# Patient Record
Sex: Male | Born: 1960 | Race: White | Hispanic: No | Marital: Married | Smoking: Former smoker
Health system: Southern US, Community
[De-identification: ages and names within clinical notes are randomized; demographics above are authoritative.]

## PROBLEM LIST (undated history)

## (undated) DIAGNOSIS — F419 Anxiety disorder, unspecified: Secondary | ICD-10-CM

## (undated) DIAGNOSIS — I82409 Acute embolism and thrombosis of unspecified deep veins of unspecified lower extremity: Secondary | ICD-10-CM

## (undated) DIAGNOSIS — I2699 Other pulmonary embolism without acute cor pulmonale: Secondary | ICD-10-CM

---

## 2012-03-17 ENCOUNTER — Ambulatory Visit: Payer: Self-pay | Admitting: Internal Medicine

## 2014-10-22 DIAGNOSIS — I82409 Acute embolism and thrombosis of unspecified deep veins of unspecified lower extremity: Secondary | ICD-10-CM

## 2014-10-22 DIAGNOSIS — I2699 Other pulmonary embolism without acute cor pulmonale: Secondary | ICD-10-CM

## 2014-10-22 HISTORY — DX: Acute embolism and thrombosis of unspecified deep veins of unspecified lower extremity: I82.409

## 2014-10-22 HISTORY — DX: Other pulmonary embolism without acute cor pulmonale: I26.99

## 2015-11-01 ENCOUNTER — Emergency Department: Payer: Self-pay

## 2015-11-01 ENCOUNTER — Emergency Department
Admission: EM | Admit: 2015-11-01 | Discharge: 2015-11-01 | Disposition: A | Discharge status: Home / Self Care | Payer: Self-pay | Attending: Emergency Medicine | Admitting: Emergency Medicine

## 2015-11-01 DIAGNOSIS — W108XXA Fall (on) (from) other stairs and steps, initial encounter: Secondary | ICD-10-CM | POA: Insufficient documentation

## 2015-11-01 DIAGNOSIS — S82001A Unspecified fracture of right patella, initial encounter for closed fracture: Secondary | ICD-10-CM

## 2015-11-01 DIAGNOSIS — S82041A Displaced comminuted fracture of right patella, initial encounter for closed fracture: Secondary | ICD-10-CM | POA: Insufficient documentation

## 2015-11-01 DIAGNOSIS — S82153A Displaced fracture of unspecified tibial tuberosity, initial encounter for closed fracture: Secondary | ICD-10-CM

## 2015-11-01 DIAGNOSIS — Z880 Allergy status to penicillin: Secondary | ICD-10-CM | POA: Insufficient documentation

## 2015-11-01 DIAGNOSIS — Y9289 Other specified places as the place of occurrence of the external cause: Secondary | ICD-10-CM | POA: Insufficient documentation

## 2015-11-01 DIAGNOSIS — Y9389 Activity, other specified: Secondary | ICD-10-CM | POA: Insufficient documentation

## 2015-11-01 DIAGNOSIS — S82151A Displaced fracture of right tibial tuberosity, initial encounter for closed fracture: Secondary | ICD-10-CM | POA: Insufficient documentation

## 2015-11-01 DIAGNOSIS — Y998 Other external cause status: Secondary | ICD-10-CM | POA: Insufficient documentation

## 2015-11-01 MED ORDER — OXYCODONE-ACETAMINOPHEN 5-325 MG PO TABS
1.0000 | ORAL_TABLET | Freq: Once | ORAL | Status: AC
  Administered 2015-11-01: 1 via ORAL
  Filled 2015-11-01: qty 1

## 2015-11-01 MED ORDER — MORPHINE SULFATE (PF) 4 MG/ML IV SOLN
4.0000 mg | Freq: Once | INTRAVENOUS | Status: AC
  Administered 2015-11-01: 4 mg via INTRAVENOUS
  Filled 2015-11-01: qty 1

## 2015-11-01 MED ORDER — FENTANYL CITRATE (PF) 100 MCG/2ML IJ SOLN
INTRAMUSCULAR | Status: AC
  Administered 2015-11-01: 50 ug via INTRAVENOUS
  Filled 2015-11-01: qty 2

## 2015-11-01 MED ORDER — FENTANYL CITRATE (PF) 100 MCG/2ML IJ SOLN
50.0000 ug | Freq: Once | INTRAMUSCULAR | Status: AC
  Administered 2015-11-01: 50 ug via INTRAVENOUS

## 2015-11-01 MED ORDER — OXYCODONE-ACETAMINOPHEN 5-325 MG PO TABS: 1.0000 | ORAL_TABLET | Freq: Four times a day (QID) | ORAL | Status: AC | PRN

## 2015-11-01 MED ORDER — DOCUSATE SODIUM 100 MG PO CAPS: 100.0000 mg | ORAL_CAPSULE | Freq: Every day | ORAL | Status: AC | PRN

## 2015-11-01 NOTE — ED Provider Notes (Addendum)
[facility] Emergency Department Provider Note  ____________________________________________   I have reviewed the triage vital signs and the nursing notes.   HISTORY  Chief Complaint Knee Injury and Fall    HPI Kurt Hawkins is a 55 y.o. male with a history of blood clots in the past he was consequently on blood thinners, Xarelto, was going down an icy stairs today when he hyperflexed his knee. He does not believe he hit his head. No other injury but he has pain to his knee and cannot bear weight. No nausea no vomiting no back pain no weakness no numbness. Denies headache or head injury however he cannot certainly say he did not hit his head  No past medical history on file.  There are no active problems to display for this patient.   No past surgical history on file.  No current outpatient prescriptions on file.  Allergies Penicillins  No family history on file.  Social History Social History  Substance Use Topics  . Smoking status: Not on file  . Smokeless tobacco: Not on file  . Alcohol Use: Not on file    Review of Systems Constitutional: No fever/chills Eyes: No visual changes. ENT: No sore throat. No stiff neck no neck pain Cardiovascular: Denies chest pain. Respiratory: Denies shortness of breath. Gastrointestinal:   no vomiting.  No diarrhea.  No constipation. Genitourinary: Negative for dysuria. Musculoskeletal: Negative lower extremity swelling Skin: Negative for rash. Neurological: Negative for headaches, focal weakness or numbness. 10-point ROS otherwise negative.  ____________________________________________   PHYSICAL EXAM:  VITAL SIGNS: ED Triage Vitals  Enc Vitals Group     BP 11/01/15 2034 145/85 mmHg     Pulse Rate 11/01/15 2034 77     Resp 11/01/15 2034 18     Temp --      Temp src --      SpO2 11/01/15 2034 96 %     Weight --      Height --      Head Cir --      Peak Flow --      Pain Score --       Pain Loc --      Pain Edu? --      Excl. in GC? --     Constitutional: Alert and oriented. Well appearing and in no acute distress. Eyes: Conjunctivae are normal. PERRL. EOMI. Head: Atraumatic. Nose: No congestion/rhinnorhea. Mouth/Throat: Mucous membranes are moist.  Oropharynx non-erythematous. Neck: No stridor.   Nontender with no meningismus Cardiovascular: Normal rate, regular rhythm. Grossly normal heart sounds.  Good peripheral circulation. Respiratory: Normal respiratory effort.  No retractions. Lungs CTAB. Abdominal: Soft and nontender. No distention. No guarding no rebound Back:  There is no focal tenderness or step off there is no midline tenderness there are no lesions noted. there is no CVA tenderness  Musculoskeletal: There is what appears to be a patella WITH tenderness to palpation in the proximal tibia no other deformity noted, there is a joint effusion No joint effusions, no DVT signs strong distal pulses no edema, there is no hip nose, anchor tenderness, swelling or deformity otherwise noted patient cannot straight leg raise off the bed Neurologic:  Normal speech and language. No gross focal neurologic deficits are appreciated.  Skin:  Skin is warm, dry and intact. No rash noted. Psychiatric: Mood and affect are normal. Speech and behavior are normal.  ____________________________________________   LABS (all labs ordered are listed, but only abnormal results are  displayed)  Labs Reviewed - No data to display ____________________________________________  EKG  I personally interpreted any EKGs ordered by me or triage  ____________________________________________  RADIOLOGY  I reviewed any imaging ordered by me or triage that were performed during my shift ____________________________________________   PROCEDURES  Procedure(s) performed: I personally supervised placement of knee immobilizer, neurovascular intact after  Critical Care performed:  None  ____________________________________________   INITIAL IMPRESSION / ASSESSMENT AND PLAN / ED COURSE  Pertinent labs & imaging results that were available during my care of the patient were reviewed by me and considered in my medical decision making (see chart for details).  Because he is on blood thinners I did a CT scan of his head which is negative. There is no evidence of other injury, he does have likely disrupted extensor mechanism with a patellar fracture and a tibial tuberosity fracture. Discussed with Dr. Rosita KeaMenz. He was see the patient tomorrow in his clinic.[facility]. He asked the patient to hold on his blood thinning medication tomorrow. The patient had a blood clot in July of this year, but has had no new blood clots since that time. Patient does have a hematologist I'll ask him to call his hematologist as well. Orthopedic surgery cannot repair the patient tonight we'll place him in a knee immobilizer, and they will follow-up first thing tomorrow morning  ----------------------------------------- 9:54 PM on 11/01/2015 ----------------------------------------- Patient had a blood clot this summer, 6 months ago. He had a repeat ultrasound which showed that the DVT in his leg was "nearly gone" and a CT scan which showed the PE had not completely resolved so he was replaced on Xarelto a few months ago. He has had no new clotting since the initial clot last summer. We will at the request of surgery have him hold tonight as he will need a repair tomorrow. Patient understands if he has any chest pain or shortness of breath he must take his Xarelto and return to the emergency room. He will follow up closely with orthopedic surgery first thing in the morning return precautions and follow-up given and understood. No other injury noted.  ____________________________________________   FINAL CLINICAL IMPRESSION(S) / ED DIAGNOSES  Final diagnoses:  None     Jeanmarie PlantJames A McShane, MD 11/01/15  2122  Jeanmarie PlantJames A McShane, MD 11/01/15 2155

## 2015-11-01 NOTE — Discharge Instructions (Signed)
Do not take your xarelto tonight or tomorrow in the morning. Go to see orthopedic surgeon first thing. If you have chest pain or shortness of breath, which we do not anticipate, return take your xarelto and return to the emergency room.     How to Use a Knee Brace A knee brace is a device that you wear to support your knee, especially if the knee is healing after an injury or surgery. There are several types of knee braces. Some are designed to prevent an injury (prophylactic brace). These are often worn during sports. Others support an injured knee (functional brace) or keep it still while it heals (rehabilitative brace). People with severe arthritis of the knee may benefit from a brace that takes some pressure off the knee (unloader brace). Most knee braces are made from a combination of cloth and metal or plastic.  You may need to wear a knee brace to:  Relieve knee pain.  Help your knee support your weight (improve stability).  Help you walk farther (improve mobility).  Prevent injury.  Support your knee while it heals from surgery or from an injury. RISKS AND COMPLICATIONS Generally, knee braces are very safe to wear. However, problems may occur, including:  Skin irritation that may lead to infection.  Making your condition worse if you wear the brace in the wrong way. HOW TO USE A KNEE BRACE Different braces will have different instructions for use. Your health care provider will tell you or show you:  How to put on your brace.  How to adjust the brace.  When and how often to wear the brace.  How to remove the brace.  If you will need any assistive devices in addition to the brace, such as crutches or a cane. In general, your brace should:  Have the hinge of the brace line up with the bend of your knee.  Have straps, hooks, or tapes that fasten snugly around your leg.  Not feel too tight or too loose. HOW TO CARE FOR A KNEE BRACE  Check your brace often for signs of  damage, such as loose connections or attachments. Your knee brace may get damaged or wear out during normal use.  Wash the fabric parts of your brace with soap and water.  Read the insert that comes with your brace for other specific care instructions. SEEK MEDICAL CARE IF:  Your knee brace is too loose or too tight and you cannot adjust it.  Your knee brace causes skin redness, swelling, bruising, or irritation.  Your knee brace is not helping.  Your knee brace is making your knee pain worse.   This information is not intended to replace advice given to you by your health care provider. Make sure you discuss any questions you have with your health care provider.   Document Released: 12/29/2003 Document Revised: 06/29/2015 Document Reviewed: 01/31/2015 Elsevier Interactive Patient Education [employer]2016 Elsevier Inc.

## 2015-11-01 NOTE — ED Notes (Signed)
Pt slipped on top step and injured his right knee. Pt is on blood thinners. Pt denies hitting his head or LOC.

## 2015-11-03 ENCOUNTER — Encounter
Admission: RE | Disposition: A | Discharge status: Home / Self Care | Payer: Self-pay | Source: Ambulatory Visit | Attending: Orthopedic Surgery

## 2015-11-03 ENCOUNTER — Ambulatory Visit
Admission: RE | Admit: 2015-11-03 | Discharge: 2015-11-04 | Disposition: A | Discharge status: Home / Self Care | Payer: Self-pay | Source: Ambulatory Visit | Attending: Orthopedic Surgery | Admitting: Orthopedic Surgery

## 2015-11-03 ENCOUNTER — Ambulatory Visit: Payer: MEDICAID

## 2015-11-03 ENCOUNTER — Encounter: Payer: Self-pay | Admitting: *Deleted

## 2015-11-03 ENCOUNTER — Ambulatory Visit: Payer: Self-pay

## 2015-11-03 ENCOUNTER — Ambulatory Visit: Payer: Self-pay | Admitting: Anesthesiology

## 2015-11-03 DIAGNOSIS — J320 Chronic maxillary sinusitis: Secondary | ICD-10-CM | POA: Insufficient documentation

## 2015-11-03 DIAGNOSIS — M109 Gout, unspecified: Secondary | ICD-10-CM | POA: Insufficient documentation

## 2015-11-03 DIAGNOSIS — Z23 Encounter for immunization: Secondary | ICD-10-CM | POA: Insufficient documentation

## 2015-11-03 DIAGNOSIS — Z9889 Other specified postprocedural states: Secondary | ICD-10-CM | POA: Insufficient documentation

## 2015-11-03 DIAGNOSIS — S82153A Displaced fracture of unspecified tibial tuberosity, initial encounter for closed fracture: Secondary | ICD-10-CM | POA: Diagnosis present

## 2015-11-03 DIAGNOSIS — Z791 Long term (current) use of non-steroidal anti-inflammatories (NSAID): Secondary | ICD-10-CM | POA: Insufficient documentation

## 2015-11-03 DIAGNOSIS — Z419 Encounter for procedure for purposes other than remedying health state, unspecified: Secondary | ICD-10-CM

## 2015-11-03 DIAGNOSIS — S82151A Displaced fracture of right tibial tuberosity, initial encounter for closed fracture: Secondary | ICD-10-CM | POA: Insufficient documentation

## 2015-11-03 DIAGNOSIS — W001XXA Fall from stairs and steps due to ice and snow, initial encounter: Secondary | ICD-10-CM | POA: Insufficient documentation

## 2015-11-03 DIAGNOSIS — Z7901 Long term (current) use of anticoagulants: Secondary | ICD-10-CM | POA: Insufficient documentation

## 2015-11-03 DIAGNOSIS — Z880 Allergy status to penicillin: Secondary | ICD-10-CM | POA: Insufficient documentation

## 2015-11-03 DIAGNOSIS — N200 Calculus of kidney: Secondary | ICD-10-CM | POA: Insufficient documentation

## 2015-11-03 DIAGNOSIS — Z86711 Personal history of pulmonary embolism: Secondary | ICD-10-CM | POA: Insufficient documentation

## 2015-11-03 HISTORY — DX: Other pulmonary embolism without acute cor pulmonale: I26.99

## 2015-11-03 HISTORY — DX: Anxiety disorder, unspecified: F41.9

## 2015-11-03 HISTORY — DX: Acute embolism and thrombosis of unspecified deep veins of unspecified lower extremity: I82.409

## 2015-11-03 HISTORY — PX: ORIF PATELLA: SHX5033

## 2015-11-03 SURGERY — OPEN REDUCTION INTERNAL FIXATION (ORIF) PATELLA
Anesthesia: General | Site: Leg Upper | Laterality: Right | Wound class: Clean

## 2015-11-03 MED ORDER — PROPOFOL 10 MG/ML IV BOLUS
INTRAVENOUS | Status: DC | PRN
  Administered 2015-11-03: 160 mg via INTRAVENOUS

## 2015-11-03 MED ORDER — MORPHINE SULFATE (PF) 2 MG/ML IV SOLN
2.0000 mg | Freq: Once | INTRAVENOUS | Status: AC
  Administered 2015-11-03: 2 mg via INTRAVENOUS

## 2015-11-03 MED ORDER — CLINDAMYCIN PHOSPHATE 600 MG/50ML IV SOLN
600.0000 mg | Freq: Four times a day (QID) | INTRAVENOUS | Status: AC
  Administered 2015-11-03 – 2015-11-04 (×3): 600 mg via INTRAVENOUS
  Filled 2015-11-03 (×3): qty 50

## 2015-11-03 MED ORDER — OXYCODONE HCL 5 MG PO TABS: 5.0000 mg | ORAL_TABLET | ORAL | Status: AC | PRN

## 2015-11-03 MED ORDER — ONDANSETRON HCL 4 MG PO TABS: 4.0000 mg | ORAL_TABLET | Freq: Four times a day (QID) | ORAL | Status: DC | PRN

## 2015-11-03 MED ORDER — RIVAROXABAN 20 MG PO TABS
20.0000 mg | ORAL_TABLET | Freq: Every day | ORAL | Status: DC
  Administered 2015-11-04: 20 mg via ORAL
  Filled 2015-11-03: qty 1

## 2015-11-03 MED ORDER — DEXAMETHASONE SODIUM PHOSPHATE 4 MG/ML IJ SOLN
INTRAMUSCULAR | Status: DC | PRN
  Administered 2015-11-03: 5 mg via INTRAVENOUS

## 2015-11-03 MED ORDER — OXYCODONE-ACETAMINOPHEN 5-325 MG PO TABS: 1.0000 | ORAL_TABLET | Freq: Four times a day (QID) | ORAL | Status: AC | PRN

## 2015-11-03 MED ORDER — MIDAZOLAM HCL 2 MG/2ML IJ SOLN
INTRAMUSCULAR | Status: DC | PRN
  Administered 2015-11-03: 2 mg via INTRAVENOUS

## 2015-11-03 MED ORDER — KETAMINE HCL 50 MG/ML IJ SOLN
INTRAMUSCULAR | Status: DC | PRN
  Administered 2015-11-03: 50 mg via INTRAVENOUS

## 2015-11-03 MED ORDER — LACTATED RINGERS IV SOLN
INTRAVENOUS | Status: DC
  Administered 2015-11-03: 14:00:00 via INTRAVENOUS

## 2015-11-03 MED ORDER — NEOMYCIN-POLYMYXIN B GU 40-200000 IR SOLN
Status: AC
  Filled 2015-11-03: qty 2

## 2015-11-03 MED ORDER — ACETAMINOPHEN 10 MG/ML IV SOLN
INTRAVENOUS | Status: AC
  Filled 2015-11-03: qty 100

## 2015-11-03 MED ORDER — OXYCODONE HCL 5 MG/5ML PO SOLN: 5.0000 mg | Freq: Once | ORAL | Status: AC | PRN

## 2015-11-03 MED ORDER — MAGNESIUM CITRATE PO SOLN: 1.0000 | Freq: Once | ORAL | Status: DC | PRN

## 2015-11-03 MED ORDER — FENTANYL CITRATE (PF) 100 MCG/2ML IJ SOLN
INTRAMUSCULAR | Status: DC | PRN
  Administered 2015-11-03 (×5): 25 ug via INTRAVENOUS
  Administered 2015-11-03: 50 ug via INTRAVENOUS
  Administered 2015-11-03 (×2): 25 ug via INTRAVENOUS

## 2015-11-03 MED ORDER — OXYCODONE HCL 5 MG PO TABS
ORAL_TABLET | ORAL | Status: AC
  Administered 2015-11-03: 5 mg via ORAL
  Filled 2015-11-03: qty 1

## 2015-11-03 MED ORDER — HYDROMORPHONE HCL 1 MG/ML IJ SOLN
INTRAMUSCULAR | Status: AC
  Administered 2015-11-03: 0.5 mg via INTRAVENOUS
  Filled 2015-11-03: qty 1

## 2015-11-03 MED ORDER — MORPHINE SULFATE (PF) 2 MG/ML IV SOLN
INTRAVENOUS | Status: AC
  Administered 2015-11-03: 2 mg via INTRAVENOUS
  Filled 2015-11-03: qty 1

## 2015-11-03 MED ORDER — OXYCODONE HCL 5 MG PO TABS
5.0000 mg | ORAL_TABLET | Freq: Once | ORAL | Status: AC | PRN
  Administered 2015-11-03: 5 mg via ORAL

## 2015-11-03 MED ORDER — MORPHINE SULFATE (PF) 2 MG/ML IV SOLN
INTRAVENOUS | Status: AC
  Filled 2015-11-03: qty 1

## 2015-11-03 MED ORDER — MAGNESIUM HYDROXIDE 400 MG/5ML PO SUSP: 30.0000 mL | Freq: Every day | ORAL | Status: DC | PRN

## 2015-11-03 MED ORDER — ONDANSETRON HCL 4 MG/2ML IJ SOLN
INTRAMUSCULAR | Status: DC | PRN
  Administered 2015-11-03: 4 mg via INTRAVENOUS

## 2015-11-03 MED ORDER — METOCLOPRAMIDE HCL 5 MG/ML IJ SOLN: 5.0000 mg | Freq: Three times a day (TID) | INTRAMUSCULAR | Status: DC | PRN

## 2015-11-03 MED ORDER — OXYCODONE HCL 5 MG PO TABS
5.0000 mg | ORAL_TABLET | ORAL | Status: DC | PRN
  Administered 2015-11-03 – 2015-11-04 (×3): 10 mg via ORAL
  Administered 2015-11-04: 5 mg via ORAL
  Administered 2015-11-04: 10 mg via ORAL
  Filled 2015-11-03 (×3): qty 2
  Filled 2015-11-03: qty 1
  Filled 2015-11-03: qty 2

## 2015-11-03 MED ORDER — NEOMYCIN-POLYMYXIN B GU 40-200000 IR SOLN
Status: DC | PRN
  Administered 2015-11-03: 2 mL

## 2015-11-03 MED ORDER — LIDOCAINE HCL (CARDIAC) 20 MG/ML IV SOLN
INTRAVENOUS | Status: DC | PRN
  Administered 2015-11-03: 100 mg via INTRAVENOUS

## 2015-11-03 MED ORDER — SODIUM CHLORIDE 0.9 % IV SOLN
INTRAVENOUS | Status: DC
  Administered 2015-11-03 – 2015-11-04 (×2): via INTRAVENOUS

## 2015-11-03 MED ORDER — MORPHINE SULFATE (PF) 4 MG/ML IV SOLN
4.0000 mg | INTRAVENOUS | Status: DC | PRN
  Administered 2015-11-03: 4 mg via INTRAVENOUS
  Filled 2015-11-03: qty 1

## 2015-11-03 MED ORDER — DOCUSATE SODIUM 100 MG PO CAPS
100.0000 mg | ORAL_CAPSULE | Freq: Two times a day (BID) | ORAL | Status: DC
  Administered 2015-11-03 – 2015-11-04 (×2): 100 mg via ORAL
  Filled 2015-11-03 (×2): qty 1

## 2015-11-03 MED ORDER — HYDROMORPHONE HCL 1 MG/ML IJ SOLN
0.2500 mg | INTRAMUSCULAR | Status: DC | PRN
Start: 2015-11-03 — End: 2015-11-03
  Administered 2015-11-03 (×4): 0.5 mg via INTRAVENOUS

## 2015-11-03 MED ORDER — ONDANSETRON HCL 4 MG/2ML IJ SOLN: 4.0000 mg | Freq: Four times a day (QID) | INTRAMUSCULAR | Status: DC | PRN

## 2015-11-03 MED ORDER — ONDANSETRON HCL 4 MG/2ML IJ SOLN: 4.0000 mg | Freq: Once | INTRAMUSCULAR | Status: DC | PRN

## 2015-11-03 MED ORDER — CLINDAMYCIN PHOSPHATE 900 MG/50ML IV SOLN
INTRAVENOUS | Status: AC
  Administered 2015-11-03: 900 mg via INTRAVENOUS
  Filled 2015-11-03: qty 50

## 2015-11-03 MED ORDER — INFLUENZA VAC SPLIT QUAD 0.5 ML IM SUSY
0.5000 mL | PREFILLED_SYRINGE | INTRAMUSCULAR | Status: AC
  Administered 2015-11-04: 0.5 mL via INTRAMUSCULAR
  Filled 2015-11-03: qty 0.5

## 2015-11-03 MED ORDER — ACETAMINOPHEN 10 MG/ML IV SOLN
INTRAVENOUS | Status: DC | PRN
  Administered 2015-11-03: 1000 mg via INTRAVENOUS

## 2015-11-03 MED ORDER — CLINDAMYCIN PHOSPHATE 900 MG/50ML IV SOLN: 900.0000 mg | Freq: Once | INTRAVENOUS | Status: DC

## 2015-11-03 MED ORDER — GLYCOPYRROLATE 0.2 MG/ML IJ SOLN
INTRAMUSCULAR | Status: DC | PRN
  Administered 2015-11-03: 0.2 mg via INTRAVENOUS

## 2015-11-03 MED ORDER — METOCLOPRAMIDE HCL 5 MG PO TABS: 5.0000 mg | ORAL_TABLET | Freq: Three times a day (TID) | ORAL | Status: DC | PRN

## 2015-11-03 SURGICAL SUPPLY — 46 items
ANCHOR SUT QUATTRO KNTLS 4.5 (Anchor) ×3 IMPLANT
BANDAGE ELASTIC 6 LF NS (GAUZE/BANDAGES/DRESSINGS) ×3 IMPLANT
BIT DRILL Q/COUPLING 1 (BIT) ×3 IMPLANT
BLADE SURG SZ10 CARB STEEL (BLADE) ×6 IMPLANT
BNDG COHESIVE 4X5 TAN STRL (GAUZE/BANDAGES/DRESSINGS) ×3 IMPLANT
CANISTER SUCT 1200ML W/VALVE (MISCELLANEOUS) ×3 IMPLANT
CATH IV ANGIO 16GX3.25 GREY (CATHETERS) ×1 IMPLANT
CHLORAPREP W/TINT 26ML (MISCELLANEOUS) ×6 IMPLANT
DRAPE C-ARM XRAY 36X54 (DRAPES) ×3 IMPLANT
DRAPE C-ARMOR (DRAPES) ×3 IMPLANT
DRAPE INCISE IOBAN 66X45 STRL (DRAPES) ×3 IMPLANT
DRAPE U-SHAPE 47X51 STRL (DRAPES) ×3 IMPLANT
ELECT CAUTERY BLADE 6.4 (BLADE) ×3 IMPLANT
GAUZE PETRO XEROFOAM 1X8 (MISCELLANEOUS) ×3 IMPLANT
GAUZE SPONGE 4X4 12PLY STRL (GAUZE/BANDAGES/DRESSINGS) ×3 IMPLANT
GLOVE SURG ORTHO 9.0 STRL STRW (GLOVE) ×3 IMPLANT
GOWN SPECIALTY ULTRA XL (MISCELLANEOUS) ×3 IMPLANT
GOWN STRL REUS W/ TWL LRG LVL3 (GOWN DISPOSABLE) ×1 IMPLANT
GOWN STRL REUS W/TWL LRG LVL3 (GOWN DISPOSABLE) ×2
GUIDEWIRE 2.0MM (WIRE) ×3 IMPLANT
HANDLE YANKAUER SUCT BULB TIP (MISCELLANEOUS) ×3 IMPLANT
HEMOVAC 400CC 10FR (MISCELLANEOUS) ×3 IMPLANT
IMMOB KNEE 24 THIGH 24 443303 (SOFTGOODS) ×3 IMPLANT
IV CATH ANGIO 16GX3.25 GREY (CATHETERS) ×3
NS IRRIG 500ML POUR BTL (IV SOLUTION) ×3 IMPLANT
PACK EXTREMITY ARMC (MISCELLANEOUS) ×3 IMPLANT
PAD ABD DERMACEA PRESS 5X9 (GAUZE/BANDAGES/DRESSINGS) ×3 IMPLANT
PAD CAST CTTN 4X4 STRL (SOFTGOODS) ×1 IMPLANT
PAD GROUND ADULT SPLIT (MISCELLANEOUS) ×3 IMPLANT
PADDING CAST COTTON 4X4 STRL (SOFTGOODS) ×2
SCREW CORTEX ST 4.5X48 (Screw) ×3 IMPLANT
SCREW CORTEX ST 4.5X68 (Screw) ×3 IMPLANT
SPONGE LAP 18X18 5 PK (GAUZE/BANDAGES/DRESSINGS) ×3 IMPLANT
STAPLER SKIN PROX 35W (STAPLE) ×3 IMPLANT
STOCKINETTE M/LG 89821 (MISCELLANEOUS) ×3 IMPLANT
STRAP SAFETY BODY (MISCELLANEOUS) ×3 IMPLANT
SUT FIBERWIRE #5 38 CONV BLUE (SUTURE) ×6
SUT ORTHOCORD W/MULTIPK NDL (SUTURE) ×3 IMPLANT
SUT STEEL 7 (SUTURE) ×3 IMPLANT
SUT VIC AB 0 CT1 27 (SUTURE) ×2
SUT VIC AB 0 CT1 27XCR 8 STRN (SUTURE) ×1 IMPLANT
SUT VIC AB 0 CT1 36 (SUTURE) ×3 IMPLANT
SUT VIC AB 2-0 CT1 27 (SUTURE) ×2
SUT VIC AB 2-0 CT1 TAPERPNT 27 (SUTURE) ×1 IMPLANT
SUTURE FIBERWR #5 38 CONV BLUE (SUTURE) ×2 IMPLANT
SYRINGE 10CC LL (SYRINGE) ×3 IMPLANT

## 2015-11-03 NOTE — H&P (Signed)
Reviewed paper H+P, will be scanned into chart. No changes noted.  

## 2015-11-03 NOTE — Op Note (Signed)
11/03/2015  4:56 PM  PATIENT:  Kurt Hawkins  55 y.o. male  PRE-OPERATIVE DIAGNOSIS:  TIBIA FRACTURE tibial tuberosity avulsion fracture  POST-OPERATIVE DIAGNOSIS:  TIBIA FRACTURE same  PROCEDURE:  Procedure(s): OPEN REDUCTION INTERNAL (ORIF) FIXATION PATELLA/ TIBIA TUBEROSITY (Right)  SURGEON: Leitha SchullerMichael J Mertie Haslem, MD  ASSISTANTS: None  ANESTHESIA:   general  EBL:  Total I/O In: 800 [I.V.:800] Out: 100 [Blood:100]  BLOOD ADMINISTERED:none  DRAINS: none   LOCAL MEDICATIONS USED:  NONE  SPECIMEN:  No Specimen  DISPOSITION OF SPECIMEN:  N/A  COUNTS:  YES  TOURNIQUET:    IMPLANTS: 4.5 cortical screw Quattro knotless anchor with sutures  DICTATION: .Dragon Dictation patient brought the operating room and after adequate anesthesia was obtained the right leg was prepped and draped in sterile fashion. After patient identification and timeout procedure were completed, the tourniquet was raised to 300 mmHg. Midline skin incision was made and this revealed avulsion fracture of tibial tubercle with most of the patellar tendon attached to the bone fragment although there were portions of the patellar tendon that had avulsed off the patella after irrigating and removing clot it appeared that this was more of an Osgood-Schlatter type tibial tubercle and lateral was sclerotic tissue at the base and this was drilled with a K wire to get better bleeding off the proximal tibia to had bone to bone healing. Sutures were then passed through the tendon in a Krakw type suture on the medial and lateral sides for subsequent additional fixation with a suture anchor the bone fragment was predrilled with a 3.5 drill bit and then pinned into place then drilled and a 48 mm screw inserted that compressed the fracture. Next the sutures were passed through the Quattro knotless anchor and the hole was predrilled the anchor was sunk and with the appropriate tension on the sutures the anchor was buried reinforcing  the repair. The knee was then placed through a range of motion and the repair appeared very stable and permanent C-arm views showed appropriate position. #1 Vicryl was used to repair the medial and lateral capsule. 2-0 Vicryl used subcutaneously followed by skin staples. Xeroform 4 x 4 ABDs and web roll and Ace wrap applied tourniquet let down to close the case  PLAN OF CARE: Admit for overnight observation  PATIENT DISPOSITION:  PACU - hemodynamically stable.

## 2015-11-03 NOTE — Transfer of Care (Signed)
Immediate Anesthesia Transfer of Care Note  Patient: Kurt Hawkins  Procedure(s) Performed: Procedure(s): OPEN REDUCTION INTERNAL (ORIF) FIXATION PATELLA/ TIBIA TUBEROSITY (Right)  Patient Location: PACU  Anesthesia Type:General  Level of Consciousness: sedated and patient cooperative  Airway & Oxygen Therapy: Patient Spontanous Breathing and Patient connected to nasal cannula oxygen  Post-op Assessment: Report given to RN and Post -op Vital signs reviewed and stable  Post vital signs: Reviewed and stable  Last Vitals:  Filed Vitals:   11/03/15 1320 11/03/15 1653  BP: 170/83 165/95  Pulse: 80 94  Temp: 36.9 C 36.8 C  Resp: 16 13    Complications: No apparent anesthesia complications

## 2015-11-03 NOTE — Anesthesia Preprocedure Evaluation (Addendum)
Anesthesia Evaluation  Patient identified by MRN, date of birth, ID band Patient awake    Reviewed: Allergy & Precautions, NPO status , Patient's Chart, lab work & pertinent test results, reviewed documented beta blocker date and time   Airway Mallampati: II  TM Distance: >3 FB     Dental  (+) Chipped   Pulmonary sleep apnea ,           Cardiovascular      Neuro/Psych    GI/Hepatic   Endo/Other    Renal/GU Renal InsufficiencyRenal disease     Musculoskeletal   Abdominal   Peds  Hematology   Anesthesia Other Findings Off Xarelto for 2 days. Does not want spinal. Does not use CPAP regular, but will use tonite. Takes colchicine for gout. Smokes. Obese. Old CXR showed pleural effusion. OK now. Hx of PE and DVT.  Reproductive/Obstetrics                            Anesthesia Physical Anesthesia Plan  ASA: III  Anesthesia Plan: General and Spinal   Post-op Pain Management:    Induction: Intravenous  Airway Management Planned: LMA  Additional Equipment:   Intra-op Plan:   Post-operative Plan:   Informed Consent: I have reviewed the patients History and Physical, chart, labs and discussed the procedure including the risks, benefits and alternatives for the proposed anesthesia with the patient or authorized representative who has indicated his/her understanding and acceptance.     Plan Discussed with: CRNA  Anesthesia Plan Comments:         Anesthesia Quick Evaluation

## 2015-11-03 NOTE — Discharge Summary (Signed)
Physician Discharge Summary  Patient ID: Kurt Hawkins MRN: [id number] DOB/AGE: 54-Oct-1962 54 y.o.  Admit date: 11/03/2015 Discharge date: 11/03/2015  Admission Diagnoses:  TIBIA FRACTURE   Discharge Diagnoses: Patient Active Problem List   Diagnosis Date Noted  . Fracture of tibial tuberosity 11/03/2015    Past Medical History  Diagnosis Date  . PE (pulmonary embolism) 2016  . DVT (deep venous thrombosis) (HCC) 2016  . Anxiety      Transfusion: none   Consultants (if any):    Discharged Condition: Improved  Hospital Course: Kurt Hawkins is an 55 y.o. male who was admitted 11/03/2015 with a diagnosis of displaced tibial tubercle fracture and went to the operating room on 11/03/2015 and underwent the above named procedures.    Surgeries: Procedure(s): OPEN REDUCTION INTERNAL (ORIF) FIXATION PATELLA/ TIBIA TUBEROSITY on 11/03/2015 Patient tolerated the surgery well. Taken to PACU where she was stabilized and then transferred to the orthopedic floor.  Started on xarelto. Foot pumps applied bilaterally at 80 mm. Heels elevated on bed with rolled towels. No evidence of DVT. Negative Homan. Physical therapy started on day #1 for gait training and transfer.  Patient's foley was d/c on day #1. On post op day #1 patient was stable and ready for discharge to home.    He was given perioperative antibiotics:  Anti-infectives    Start     Dose/Rate Route Frequency Ordered Stop   11/03/15 2130  clindamycin (CLEOCIN) IVPB 600 mg     600 mg 100 mL/hr over 30 Minutes Intravenous Every 6 hours 11/03/15 1755 11/04/15 1529   11/03/15 1253  clindamycin (CLEOCIN) 900 MG/50ML IVPB    Comments:  Leilani Able: cabinet override      11/03/15 1253 11/03/15 1554   11/03/15 1245  clindamycin (CLEOCIN) IVPB 900 mg  Status:  Discontinued     900 mg 100 mL/hr over 30 Minutes Intravenous  Once 11/03/15 1240 11/03/15 1754    .  He was given sequential compression devices, early  ambulation, and xarelto for DVT prophylaxis.  He benefited maximally from the hospital stay and there were no complications.    Recent vital signs:  Filed Vitals:   11/03/15 1842 11/03/15 2103  BP: 155/70 159/70  Pulse: 58 56  Temp: 98.6 F (37 C) 98.3 F (36.8 C)  Resp: 18 18    Recent laboratory studies:  No results found for: HGB No results found for: WBC, PLT No results found for: INR No results found for: NA, K, CL, CO2, BUN, CREATININE, GLUCOSE  Discharge Medications:     Medication List    TAKE these medications        colchicine 0.6 MG tablet  Take 0.6 mg by mouth daily.     docusate sodium 100 MG capsule  Commonly known as:  COLACE  Take 1 capsule (100 mg total) by mouth daily as needed.     fluticasone 50 MCG/ACT nasal spray  Commonly known as:  FLONASE  Place into both nostrils daily.     oxyCODONE 5 MG immediate release tablet  Commonly known as:  Oxy IR/ROXICODONE  Take 1-2 tablets (5-10 mg total) by mouth every 3 (three) hours as needed for severe pain or breakthrough pain.     oxyCODONE-acetaminophen 5-325 MG tablet  Commonly known as:  ROXICET  Take 1 tablet by mouth every 6 (six) hours as needed.     oxyCODONE-acetaminophen 5-325 MG tablet  Commonly known as:  ROXICET  Take 1-2 tablets by mouth  every 6 (six) hours as needed for severe pain.     rivaroxaban 20 MG Tabs tablet  Commonly known as:  XARELTO  Take 20 mg by mouth daily with supper.        Diagnostic Studies: Dg Tibia/fibula Right  11/03/2015  CLINICAL DATA:  Avulsion of the tibial tubercle EXAM: RIGHT TIBIA AND FIBULA - 2 VIEW; DG C-ARM 61-120 MIN COMPARISON:  11/01/2015 FLUOROSCOPY TIME:  Radiation Exposure Index (as provided by the fluoroscopic device): Not available If the device does not provide the exposure index: Fluoroscopy Time:  21 seconds Number of Acquired Images:  2 FINDINGS: A fixation screw is noted with reduction of the displaced tibial tubercle. No acute bony  abnormality is seen. IMPRESSION: Status post reduction and fixation of tibial tubercle avulsion. Electronically Signed   By: Alcide CleverMark  Lukens M.D.   On: 11/03/2015 16:53   Ct Head Wo Contrast  11/01/2015  CLINICAL DATA:  Slipped and fell.  On blood thinners. EXAM: CT HEAD WITHOUT CONTRAST TECHNIQUE: Contiguous axial images were obtained from the base of the skull through the vertex without intravenous contrast. COMPARISON:  None. FINDINGS: No acute cortical infarct, hemorrhage, or mass lesion ispresent. Ventricles are of normal size. No significant extra-axial fluid collection is present. Postoperative changes involving the left maxillary sinus noted. Chronic mucoperiosteal thickening involving the left maxillary sinus identified. The mastoid air cells are clear. IMPRESSION: 1. No acute intracranial abnormalities. 2. Chronic left maxillary sinus disease. Electronically Signed   By: Signa Kellaylor  Stroud M.D.   On: 11/01/2015 21:04   Dg Knee Complete 4 Views Right  11/01/2015  CLINICAL DATA:  Acute right knee pain after slipping on steps. Initial encounter. EXAM: RIGHT KNEE - COMPLETE 4+ VIEW COMPARISON:  None. FINDINGS: There appears to be severely displaced avulsion fracture of the tibial tuberosity, as well as moderately displaced and comminuted avulsion fracture involving the inferior portion of the patella. The patella is in abnormally superior position. Mild suprapatellar joint effusion is noted. IMPRESSION: Severely and superiorly displaced avulsion fracture of the tibial tuberosity. Moderately displaced and comminuted avulsion fracture is seen involving the inferior portion of the patella, which is in abnormally superior position. Electronically Signed   By: Lupita RaiderJames  Green Jr, M.D.   On: 11/01/2015 21:07   Dg C-arm 1-60 Min  11/03/2015  CLINICAL DATA:  Avulsion of the tibial tubercle EXAM: RIGHT TIBIA AND FIBULA - 2 VIEW; DG C-ARM 61-120 MIN COMPARISON:  11/01/2015 FLUOROSCOPY TIME:  Radiation Exposure Index (as  provided by the fluoroscopic device): Not available If the device does not provide the exposure index: Fluoroscopy Time:  21 seconds Number of Acquired Images:  2 FINDINGS: A fixation screw is noted with reduction of the displaced tibial tubercle. No acute bony abnormality is seen. IMPRESSION: Status post reduction and fixation of tibial tubercle avulsion. Electronically Signed   By: Alcide CleverMark  Lukens M.D.   On: 11/03/2015 16:53    Disposition: 01-Home or Self Care        Follow-up Information    Follow up with MENZ,Perkins, MD In 3 days.   Specialty:  Orthopedic Surgery   Why:  Scheduled for Monday   Contact information:   [address] [facility] [place] [address] [phone]        Signed: Patience MuscaGAINES, THOMAS CHRISTOPHER 11/03/2015, 9:23 PM

## 2015-11-03 NOTE — Anesthesia Procedure Notes (Signed)
Procedure Name: LMA Insertion Date/Time: 11/03/2015 3:33 PM Performed by: Shirlee LimerickMARION, Jayelyn Barno Pre-anesthesia Checklist: Patient identified, Emergency Drugs available, Suction available and Patient being monitored Patient Re-evaluated:Patient Re-evaluated prior to inductionOxygen Delivery Method: Circle system utilized Preoxygenation: Pre-oxygenation with 100% oxygen Intubation Type: IV induction LMA Size: 5.0 Number of attempts: 1 Tube secured with: Tape Dental Injury: Teeth and Oropharynx as per pre-operative assessment

## 2015-11-03 NOTE — Discharge Instructions (Signed)
Touchdown weightbearing on the right leg. Keep dressing clean and dry until return visit. Resume normal dose of blood thinner

## 2015-11-03 NOTE — Anesthesia Postprocedure Evaluation (Signed)
Anesthesia Post Note  Patient: Hayes LudwigMichael J Bohnenkamp  Procedure(s) Performed: Procedure(s) (LRB): OPEN REDUCTION INTERNAL (ORIF) FIXATION PATELLA/ TIBIA TUBEROSITY (Right)  Patient location during evaluation: PACU Anesthesia Type: General Level of consciousness: awake and alert Pain management: pain level controlled Vital Signs Assessment: post-procedure vital signs reviewed and stable Respiratory status: spontaneous breathing, nonlabored ventilation, respiratory function stable and patient connected to nasal cannula oxygen Cardiovascular status: blood pressure returned to baseline and stable Postop Assessment: no signs of nausea or vomiting Anesthetic complications: no    Last Vitals:  Filed Vitals:   11/03/15 1711 11/03/15 1716  BP:    Pulse: 73 72  Temp:    Resp: 14 16    Last Pain:  Filed Vitals:   11/03/15 1717  PainSc: 8                  Jomarie LongsJoseph K Piscitello

## 2015-11-03 NOTE — OR Nursing (Signed)
Dr. Rosita KeaMenz questioned about SCD's and he stated they are not needed because patient is adequately anti-coagulated with Gibson RampXeralto.

## 2015-11-04 ENCOUNTER — Encounter: Payer: Self-pay | Admitting: Orthopedic Surgery

## 2015-11-04 NOTE — Progress Notes (Signed)
Discharge instructions and prescriptions given with verbalized understanding.  VSS.  Patient taken to visitors entrance via wheelchair by volunteer to be taken home by wife in personal vehicle.

## 2015-11-04 NOTE — Progress Notes (Signed)
   Subjective: 1 Day Post-Op Procedure(s) (LRB): OPEN REDUCTION INTERNAL (ORIF) FIXATION PATELLA/ TIBIA TUBEROSITY (Right) Patient reports pain as mild.   Patient is well, and has had no acute complaints or problems We will start therapy today.  Plan is to go Home after hospital stay.  Objective: Vital signs in last 24 hours: Temp:  [97.8 F (36.6 C)-98.6 F (37 C)] 98.3 F (36.8 C) (01/13 0500) Pulse Rate:  [56-94] 61 (01/13 0500) Resp:  [12-20] 18 (01/13 0500) BP: (137-170)/(70-95) 143/82 mmHg (01/13 0500) SpO2:  [91 %-98 %] 95 % (01/13 0500) Weight:  [117.028 kg (258 lb)] 117.028 kg (258 lb) (01/12 1816)  Intake/Output from previous day: 01/12 0701 - 01/13 0700 In: 1000 [I.V.:1000] Out: 400 [Urine:300; Blood:100] Intake/Output this shift: Total I/O In: 1126.3 [I.V.:1076.3; IV Piggyback:50] Out: -   No results for input(s): HGB in the last 72 hours. No results for input(s): WBC, RBC, HCT, PLT in the last 72 hours. No results for input(s): NA, K, CL, CO2, BUN, CREATININE, GLUCOSE, CALCIUM in the last 72 hours. No results for input(s): LABPT, INR in the last 72 hours.  EXAM General - Patient is Alert, Appropriate and Oriented Extremity - Neurovascular intact Sensation intact distally Intact pulses distally Dorsiflexion/Plantar flexion intact knee immobilizer intact Dressing - dressing C/D/I and no drainage Motor Function - intact, moving foot and toes well on exam.   Past Medical History  Diagnosis Date  . PE (pulmonary embolism) 2016  . DVT (deep venous thrombosis) (HCC) 2016  . Anxiety     Assessment/Plan:   1 Day Post-Op Procedure(s) (LRB): OPEN REDUCTION INTERNAL (ORIF) FIXATION PATELLA/ TIBIA TUBEROSITY (Right) Active Problems:   Fracture of tibial tuberosity  Estimated body mass index is 30.59 kg/(m^2) as calculated from the following:   Height as of this encounter: 6\' 5"  (1.956 m).   Weight as of this encounter: 117.028 kg (258 lb). Advance diet Up  with therapy Discharge home today Follow up with [facility] in 3 days  DVT Prophylaxis - Aspirin Partial Weight-Bearing as tolerated to right leg D/C O2 and Pulse OX and try on Room Air  T. [place] Neighborhris Burk Hoctor, PA-C [facility] 11/04/2015, 7:51 AM

## 2017-03-15 IMAGING — CT CT HEAD W/O CM
1 series · 16 of 30 positions shown, 20 images · non-contrast
Comparison: None.

CLINICAL DATA: Slipped and fell.  On blood thinners.

EXAM:
CT HEAD WITHOUT CONTRAST
TECHNIQUE: Contiguous axial images were obtained from the base of the skull
through the vertex without intravenous contrast.

[Series 2: head wo · axial · 0.44mm/px · z∈[+357,+483]mm · 16 of 32 slices shown, 20 images]
[im 2/32  brain]
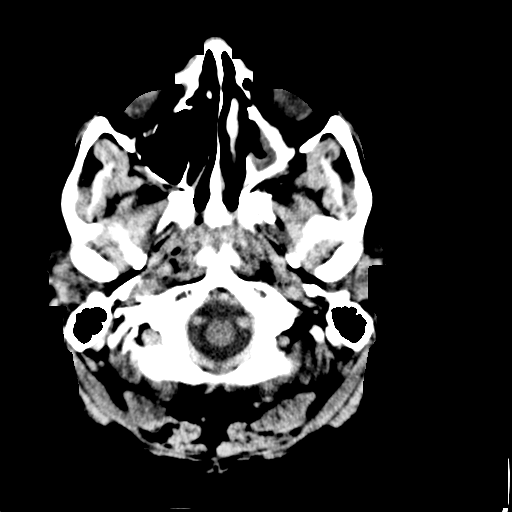
[im 2/32  bone]
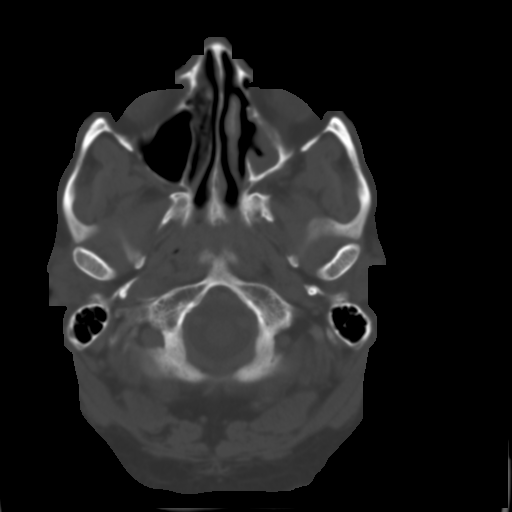
[im 4/32  brain]
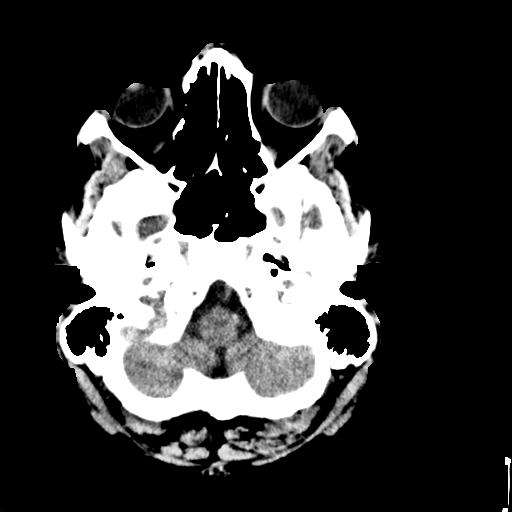
[im 6/32  brain]
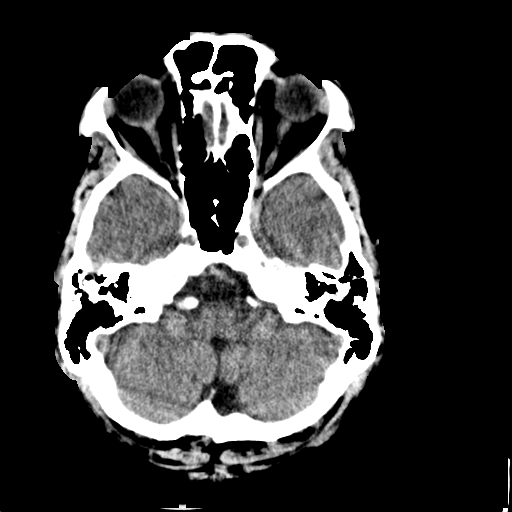
[im 8/32  brain]
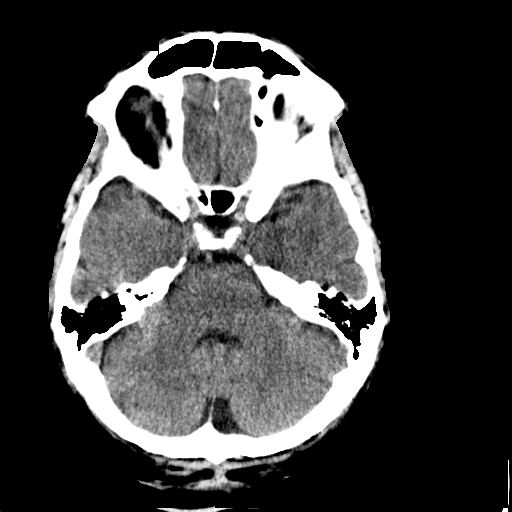
[im 9/32  brain]
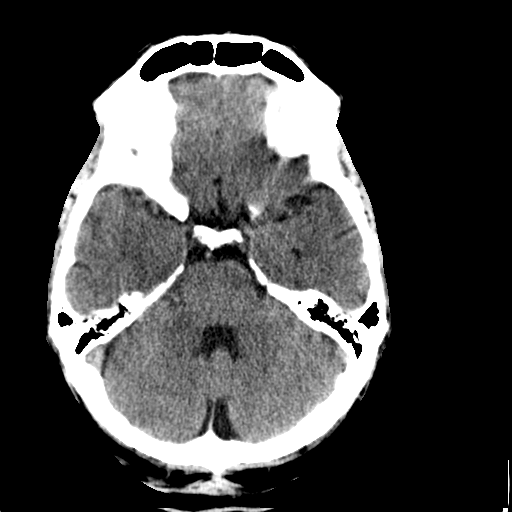
[im 9/32  bone]
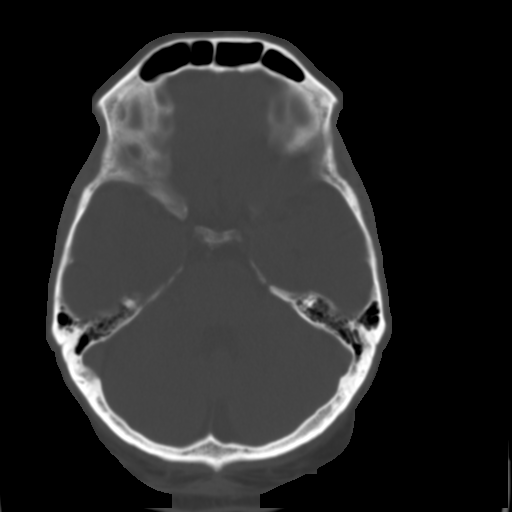
[im 11/32  brain]
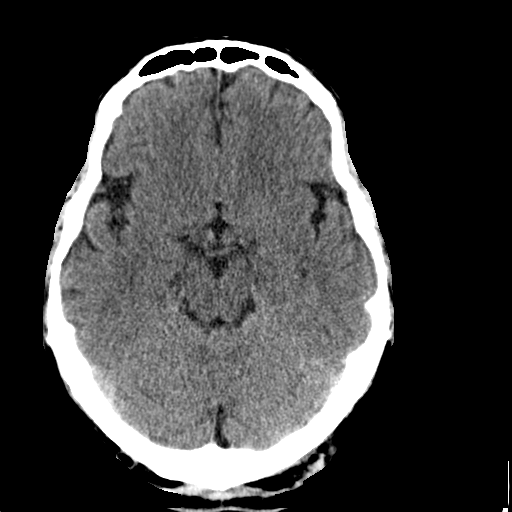
[im 13/32  brain]
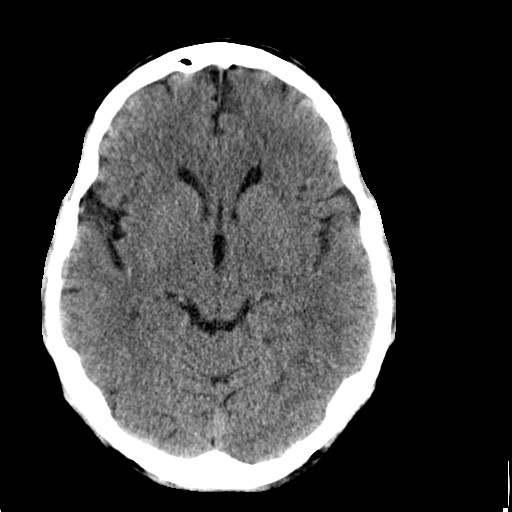
[im 15/32  brain]
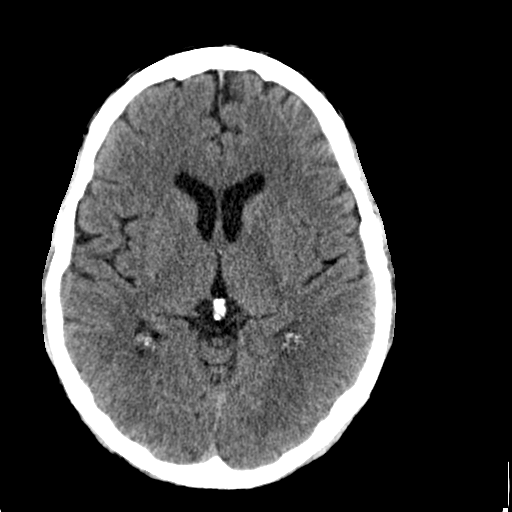
[im 17/32  brain]
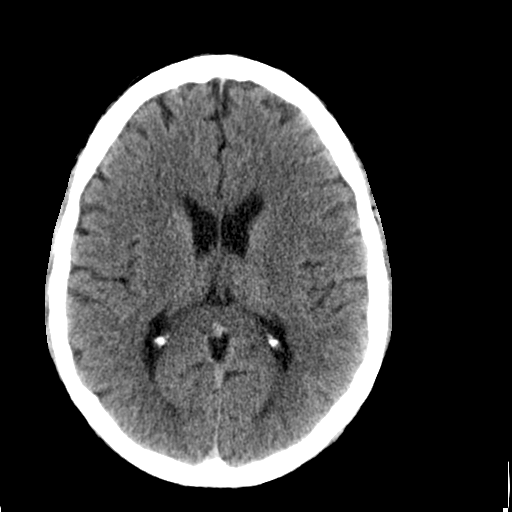
[im 17/32  bone]
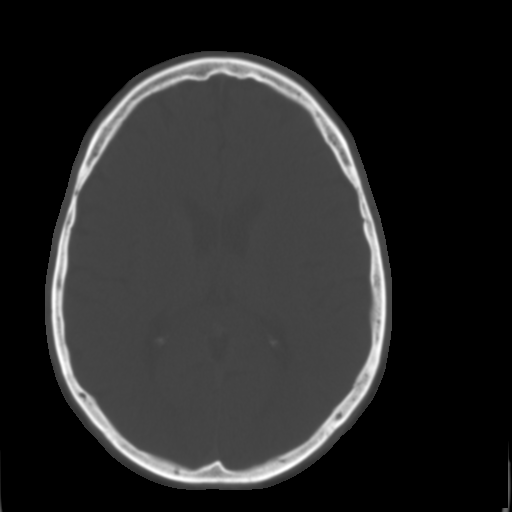
[im 19/32  brain]
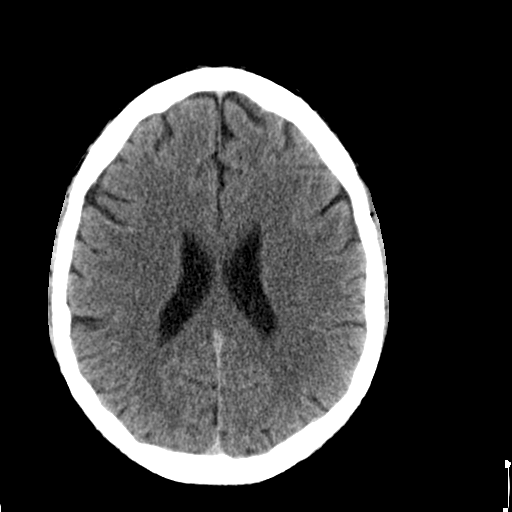
[im 21/32  brain]
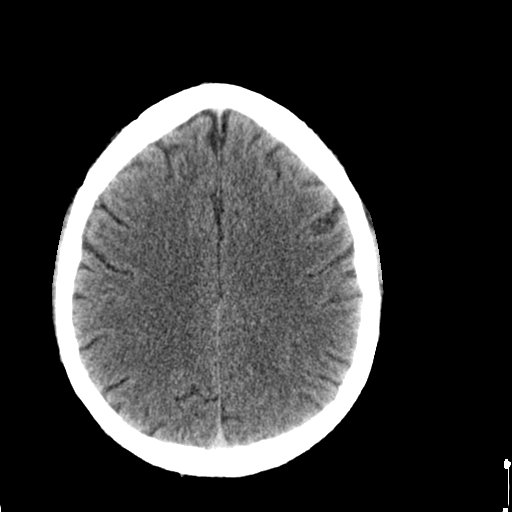
[im 23/32  brain]
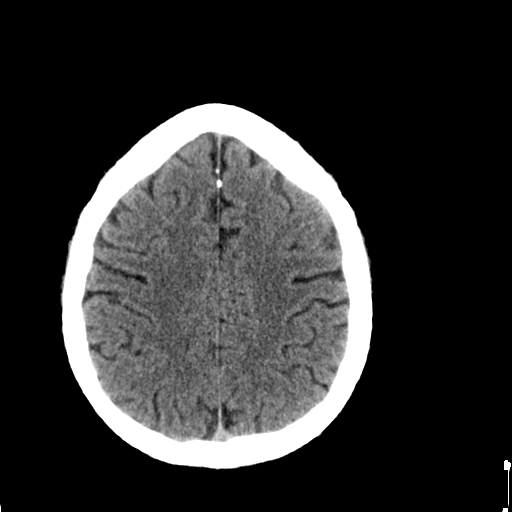
[im 24/32  brain]
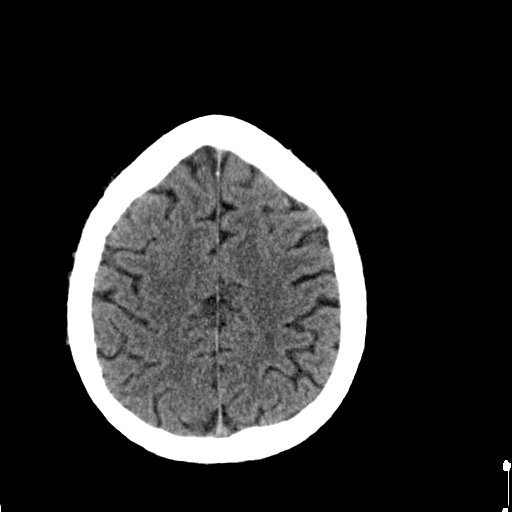
[im 24/32  bone]
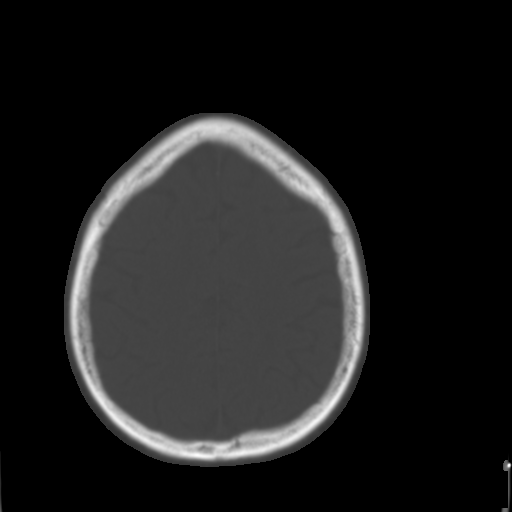
[im 26/32  brain]
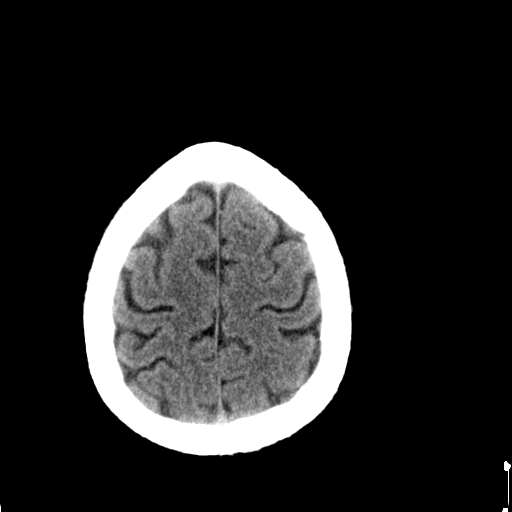
[im 28/32  brain]
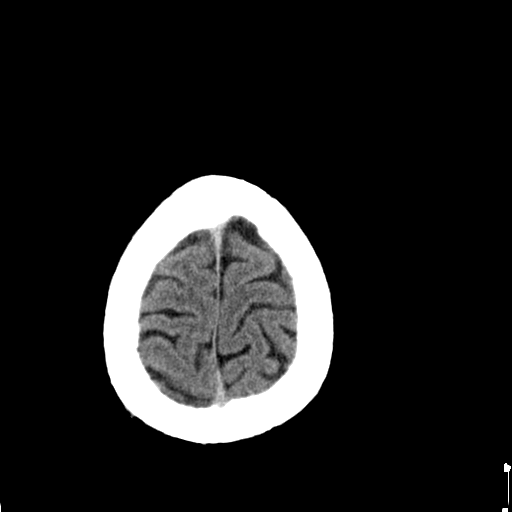
[im 30/32  brain]
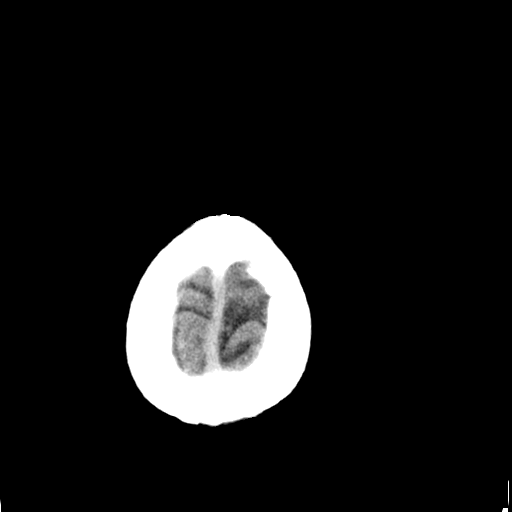

[16 of 30 positions shown; findings below may reference images not displayed]

FINDINGS: No acute cortical infarct, hemorrhage, or mass lesion ispresent.
Ventricles are of normal size. No significant extra-axial fluid
collection is present. Postoperative changes involving the left
maxillary sinus noted. Chronic mucoperiosteal thickening involving
the left maxillary sinus identified. The mastoid air cells are
clear.
IMPRESSION: 1. No acute intracranial abnormalities.
2. Chronic left maxillary sinus disease.

## 2017-03-17 IMAGING — CR DG C-ARM 61-120 MIN
2 series · 2 of 2 positions shown · non-contrast
Comparison: 11/01/2015

CLINICAL DATA: Avulsion of the tibial tubercle

EXAM:
RIGHT TIBIA AND FIBULA - 2 VIEW; DG C-ARM 61-120 MIN

[cont. (1 of 2)]
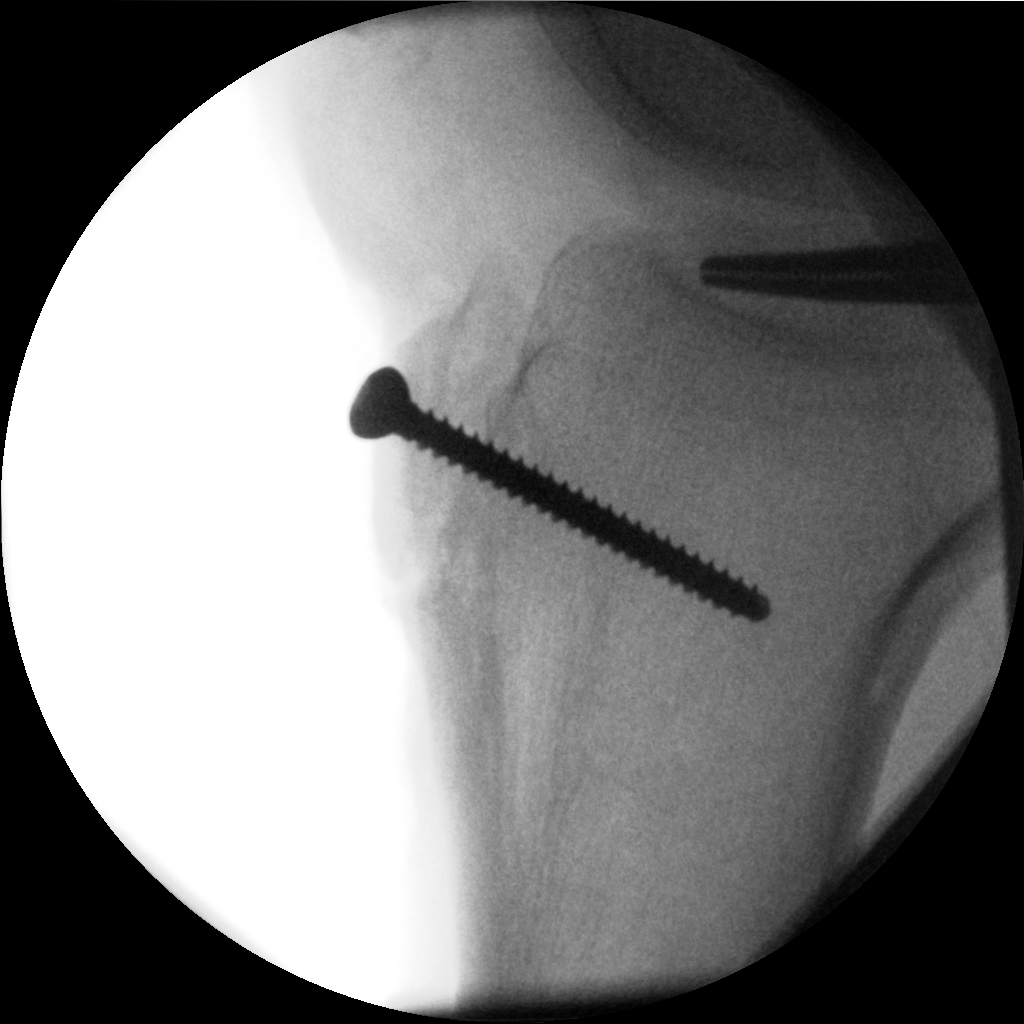

[cont. (2 of 2)]
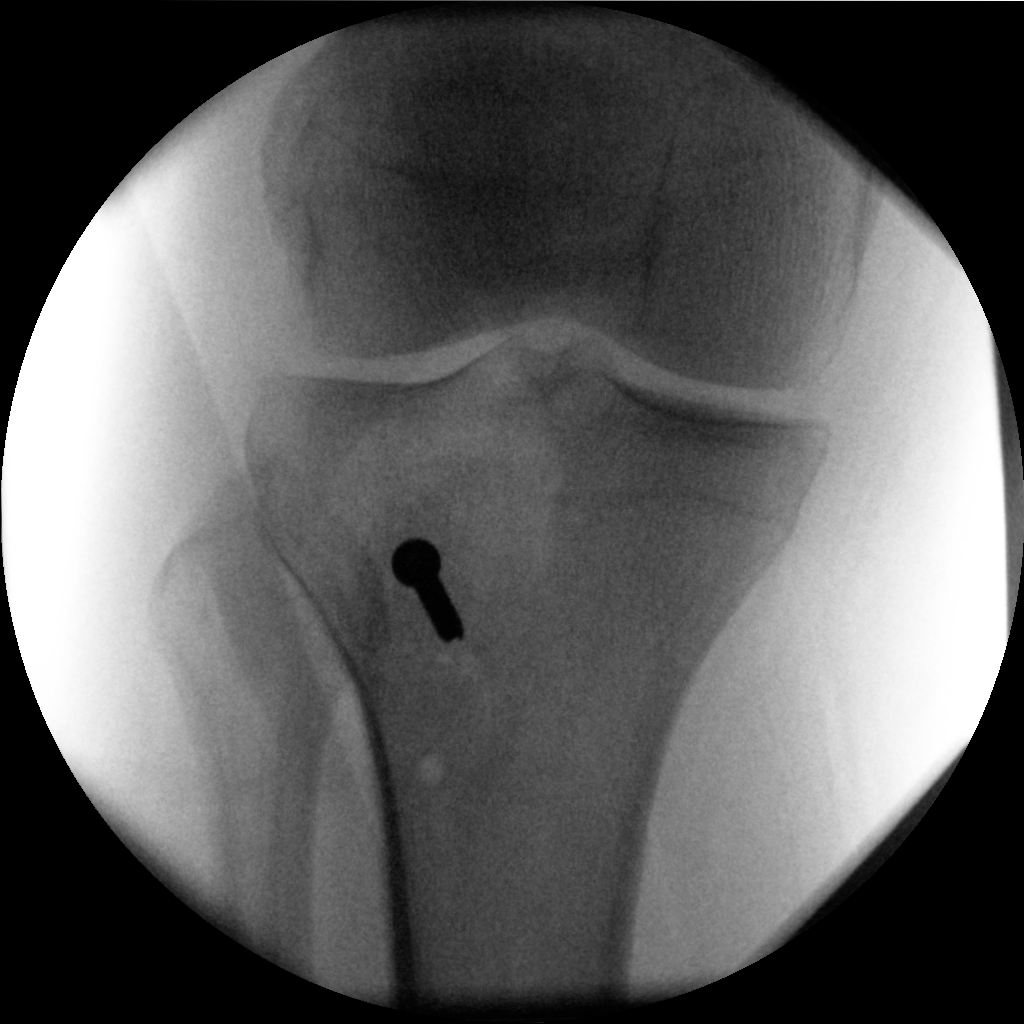

[2 of 2 positions shown; findings below may reference images not displayed]

FLUOROSCOPY TIME:  Radiation Exposure Index (as provided by the
fluoroscopic device): Not available

If the device does not provide the exposure index:

Fluoroscopy Time:  21 seconds

Number of Acquired Images:  2
FINDINGS: A fixation screw is noted with reduction of the displaced tibial
tubercle. No acute bony abnormality is seen.
IMPRESSION: Status post reduction and fixation of tibial tubercle avulsion.

## 2022-10-04 ENCOUNTER — Encounter: Payer: Self-pay | Admitting: Emergency Medicine

## 2022-10-04 ENCOUNTER — Ambulatory Visit
Admission: EM | Admit: 2022-10-04 | Discharge: 2022-10-04 | Disposition: A | Discharge status: Home / Self Care | Payer: BLUE CROSS/BLUE SHIELD

## 2022-10-04 DIAGNOSIS — U071 COVID-19: Secondary | ICD-10-CM | POA: Diagnosis not present

## 2022-10-04 MED ORDER — MOLNUPIRAVIR EUA 200MG CAPSULE: 4.0000 | ORAL_CAPSULE | Freq: Two times a day (BID) | ORAL | 0 refills | Status: AC

## 2022-10-04 NOTE — ED Triage Notes (Signed)
Pt presents with headache and bodyaches. At home covid test was positive last night.

## 2022-10-04 NOTE — ED Provider Notes (Signed)
[facility]    CSN: [id number] Arrival date & time: 10/04/22  0817      History   Chief Complaint Chief Complaint  Patient presents with   Headache   Generalized Body Aches    HPI Kurt Hawkins is a 61 y.o. male.   Patient presents with fever, chills, body aches and a generalized headache beginning 1 day ago.  Fever peaking at 99.9.  Known exposure to COVID, home test positive last night.  Unsure of change in appetite as he typically does not eat breakfast but able to tolerate fluids.  Has attempted use of Tylenol which has been minimally effective.  History of diabetes, hyperlipidemia, pulmonary embolism, DVT.   Past Medical History:  Diagnosis Date   Anxiety    DVT (deep venous thrombosis) (HCC) 2016   PE (pulmonary embolism) 2016    Patient Active Problem List   Diagnosis Date Noted   Fracture of tibial tuberosity 11/03/2015    Past Surgical History:  Procedure Laterality Date   ORIF PATELLA Right 11/03/2015   Procedure: OPEN REDUCTION INTERNAL (ORIF) FIXATION PATELLA/ TIBIA TUBEROSITY;  Surgeon: Kennedy Bucker, MD;  Location: [facility] ORS;  Service: Orthopedics;  Laterality: Right;       Home Medications    Prior to Admission medications   Medication Sig Start Date End Date Taking? Authorizing Provider  atorvastatin (LIPITOR) 10 MG tablet Take 1 tablet by mouth daily. 10/01/22 03/30/23 Yes [provider]  clopidogrel (PLAVIX) 75 MG tablet Take 1 tablet by mouth daily. 07/31/22 01/27/23 Yes [provider]  colchicine 0.6 MG tablet Take 0.6 mg by mouth daily.   Yes [provider]  diclofenac (VOLTAREN) 75 MG EC tablet Take by mouth. 10/01/22 10/01/23 Yes [provider]  gabapentin (NEURONTIN) 400 MG capsule Take by mouth. 10/01/22 10/01/23 Yes [provider]  glipiZIDE (GLUCOTROL XL) 5 MG 24 hr tablet Take by mouth. 10/01/22 10/01/23 Yes [provider]  metFORMIN (GLUCOPHAGE-XR) 500 MG 24 hr  tablet Take by mouth. 10/01/22 10/01/23 Yes [provider]  rivaroxaban (XARELTO) 20 MG TABS tablet Take 20 mg by mouth daily with supper.   Yes [provider]  Calcium Carb-Ergocalciferol 500-5 MG-MCG TABS Take by mouth.    [provider]  fluticasone (FLONASE) 50 MCG/ACT nasal spray Place into both nostrils daily.    [provider]  Multiple Vitamin (MULTI-VITAMIN) tablet Take 1 tablet by mouth daily.    [provider]  oxyCODONE (OXY IR/ROXICODONE) 5 MG immediate release tablet Take 1-2 tablets (5-10 mg total) by mouth every 3 (three) hours as needed for severe pain or breakthrough pain. 11/03/15   Evon Slack, PA-C  oxyCODONE-acetaminophen (ROXICET) 5-325 MG tablet Take 1 tablet by mouth every 6 (six) hours as needed. 11/01/15   Jeanmarie Plant, MD  oxyCODONE-acetaminophen (ROXICET) 5-325 MG tablet Take 1-2 tablets by mouth every 6 (six) hours as needed for severe pain. 11/03/15   Kennedy Bucker, MD    Family History History reviewed. No pertinent family history.  Social History Social History   Tobacco Use   Smoking status: Former    Packs/day: 0.50    Types: Cigarettes   Smokeless tobacco: Never  Vaping Use   Vaping Use: Never used  Substance Use Topics   Alcohol use: Not Currently   Drug use: Never     Allergies   Penicillins   Review of Systems Review of Systems  Constitutional:  Positive for chills and fever. Negative for  activity change, appetite change, diaphoresis, fatigue and unexpected weight change.  HENT: Negative.    Respiratory: Negative.    Cardiovascular: Negative.   Gastrointestinal: Negative.   Musculoskeletal:  Positive for myalgias. Negative for arthralgias, back pain, gait problem, joint swelling, neck pain and neck stiffness.  Neurological:  Positive for headaches. Negative for dizziness, tremors, seizures, syncope, facial asymmetry, speech difficulty, weakness, light-headedness and numbness.      Physical Exam Triage Vital Signs ED Triage Vitals  Enc Vitals Group     BP 10/04/22 0837 (!) 154/69     Pulse Rate 10/04/22 0837 81     Resp 10/04/22 0837 16     Temp 10/04/22 0837 99.2 F (37.3 C)     Temp Source 10/04/22 0837 Oral     SpO2 10/04/22 0837 98 %     Weight --      Height --      Head Circumference --      Peak Flow --      Pain Score 10/04/22 0834 4     Pain Loc --      Pain Edu? --      Excl. in GC? --    No data found.  Updated Vital Signs BP (!) 154/69 (BP Location: Left Arm)   Pulse 81   Temp 99.2 F (37.3 C) (Oral)   Resp 16   SpO2 98%   Visual Acuity Right Eye Distance:   Left Eye Distance:   Bilateral Distance:    Right Eye Near:   Left Eye Near:    Bilateral Near:     Physical Exam Constitutional:      Appearance: Normal appearance. He is well-developed.  HENT:     Head: Normocephalic.     Right Ear: Tympanic membrane, ear canal and external ear normal.     Left Ear: Tympanic membrane, ear canal and external ear normal.     Nose: Nose normal.     Mouth/Throat:     Mouth: Mucous membranes are moist.     Pharynx: Posterior oropharyngeal erythema present.  Eyes:     Extraocular Movements: Extraocular movements intact.  Cardiovascular:     Rate and Rhythm: Normal rate and regular rhythm.     Pulses: Normal pulses.     Heart sounds: Normal heart sounds.  Pulmonary:     Effort: Pulmonary effort is normal.     Breath sounds: Normal breath sounds.  Musculoskeletal:     Cervical back: Normal range of motion and neck supple.  Skin:    General: Skin is warm and dry.  Neurological:     Mental Status: He is alert and oriented to person, place, and time. Mental status is at baseline.  Psychiatric:        Mood and Affect: Mood normal.        Behavior: Behavior normal.      UC Treatments / Results  Labs (all labs ordered are listed, but only abnormal results are displayed) Labs Reviewed - No data to  display  EKG   Radiology No results found.  Procedures Procedures (including critical care time)  Medications Ordered in UC Medications - No data to display  Initial Impression / Assessment and Plan / UC Course  I have reviewed the triage vital signs and the nursing notes.  Pertinent labs & imaging results that were available during my care of the patient were reviewed by me and considered in my medical decision making (see chart for details).  COVID-19  Vital signs are stable, O2 saturation 98% on room air, lungs are clear to auscultation, home test positive, we will not repeat with PCR, discussed with patient, patient unsure if he would like to move forward with antivirals, prescribed and discussed administration, discussed quarantine guidelines per CDC recommendations, advised additional over-the-counter medications to be used as needed for management of symptoms, may follow-up with his urgent care as needed Final Clinical Impressions(s) / UC Diagnoses   Final diagnoses:  None   Discharge Instructions   None    ED Prescriptions   None    PDMP not reviewed this encounter.   Valinda Hoar, NP 10/04/22 0900

## 2022-10-04 NOTE — Discharge Instructions (Addendum)
COVID-19 is a virus that will progress to your body similar to her normal COVID, it will get better with time, if you decide to move forward with antiviral treatment, please begin before Sunday, this medicine helps to reduce the amount of virus within your body which ideally lessens the timeline that you are sick and minimizes your symptoms, does not fully take away illness  Take Antiviral every morning and every evening for 5 days  You will need to quarantine for 5 days from the onset of your symptoms per current CDC guidelines, may return activity on Monday 18th 2023    You can take Tylenol and/or Ibuprofen as needed for fever reduction and pain relief.   For cough: honey 1/2 to 1 teaspoon (you can dilute the honey in water or another fluid).  You can also use guaifenesin and dextromethorphan for cough. You can use a humidifier for chest congestion and cough.  If you don't have a humidifier, you can sit in the bathroom with the hot shower running.      For sore throat: try warm salt water gargles, cepacol lozenges, throat spray, warm tea or water with lemon/honey, popsicles or ice, or OTC cold relief medicine for throat discomfort.   For congestion: take a daily anti-histamine like Zyrtec, Claritin, and a oral decongestant, such as pseudoephedrine.  You can also use Flonase 1-2 sprays in each nostril daily.   It is important to stay hydrated: drink plenty of fluids (water, gatorade/powerade/pedialyte, juices, or teas) to keep your throat moisturized and help further relieve irritation/discomfort.
